# Patient Record
Sex: Male | Born: 1990 | Race: Black or African American | Hispanic: No | Marital: Single | Smoking: Current some day smoker
Health system: Southern US, Community
[De-identification: ages and names within clinical notes are randomized; demographics above are authoritative.]

## PROBLEM LIST (undated history)

## (undated) DIAGNOSIS — J45909 Unspecified asthma, uncomplicated: Secondary | ICD-10-CM

## (undated) DIAGNOSIS — F419 Anxiety disorder, unspecified: Secondary | ICD-10-CM

## (undated) DIAGNOSIS — G43909 Migraine, unspecified, not intractable, without status migrainosus: Secondary | ICD-10-CM

## (undated) DIAGNOSIS — S069X9A Unspecified intracranial injury with loss of consciousness of unspecified duration, initial encounter: Secondary | ICD-10-CM

---
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
## 2014-05-09 ENCOUNTER — Other Ambulatory Visit: Payer: Self-pay

## 2014-05-17 ENCOUNTER — Ambulatory Visit: Payer: Self-pay | Admitting: Family Medicine

## 2014-06-11 ENCOUNTER — Emergency Department (HOSPITAL_COMMUNITY)
Admission: EM | Admit: 2014-06-11 | Discharge: 2014-06-11 | Disposition: A | Discharge status: Home / Self Care | Payer: Self-pay | Attending: Emergency Medicine | Admitting: Emergency Medicine

## 2014-06-11 ENCOUNTER — Encounter (HOSPITAL_COMMUNITY): Payer: Self-pay

## 2014-06-11 DIAGNOSIS — Z8659 Personal history of other mental and behavioral disorders: Secondary | ICD-10-CM | POA: Insufficient documentation

## 2014-06-11 DIAGNOSIS — R630 Anorexia: Secondary | ICD-10-CM | POA: Insufficient documentation

## 2014-06-11 DIAGNOSIS — Z720 Tobacco use: Secondary | ICD-10-CM | POA: Insufficient documentation

## 2014-06-11 DIAGNOSIS — Z8679 Personal history of other diseases of the circulatory system: Secondary | ICD-10-CM | POA: Insufficient documentation

## 2014-06-11 DIAGNOSIS — R531 Weakness: Secondary | ICD-10-CM | POA: Insufficient documentation

## 2014-06-11 HISTORY — DX: Migraine, unspecified, not intractable, without status migrainosus: G43.909

## 2014-06-11 HISTORY — DX: Anxiety disorder, unspecified: F41.9

## 2014-06-11 LAB — I-STAT CHEM 8, ED
BUN: 12 mg/dL (ref 6–23)
CALCIUM ION: 1.17 mmol/L (ref 1.12–1.23)
Chloride: 104 mEq/L (ref 96–112)
Creatinine, Ser: 0.9 mg/dL (ref 0.50–1.35)
Glucose, Bld: 122 mg/dL — ABNORMAL HIGH (ref 70–99)
HCT: 49 % (ref 39.0–52.0)
Hemoglobin: 16.7 g/dL (ref 13.0–17.0)
Potassium: 3.6 mEq/L — ABNORMAL LOW (ref 3.7–5.3)
Sodium: 141 mEq/L (ref 137–147)
TCO2: 25 mmol/L (ref 0–100)

## 2014-06-11 LAB — CBG MONITORING, ED: GLUCOSE-CAPILLARY: 109 mg/dL — AB (ref 70–99)

## 2014-06-11 NOTE — ED Notes (Signed)
Bed: [id number] Expected date:  Expected time:  Means of arrival:  Comments: EMS 23 yo from shelter/syncopal episode

## 2014-06-11 NOTE — ED Notes (Signed)
[facility] from [facility] with 23 yo male with syncopal episode.  Per EMS, witnesses state he finished eating and stood up and sat back down and ? Passed out in the chair-per EMS, patient had fluttering eyes, states he told EMS he has been anxious and did not want to talk.

## 2014-06-11 NOTE — ED Notes (Signed)
Patient states he has been "feeling weak" for the past 2 days.  Patient states he has been under a lot of stress

## 2014-06-11 NOTE — ED Provider Notes (Signed)
CSN: [id number]     Arrival date & time 06/11/14  2051 History   First MD Initiated Contact with Patient 06/11/14 2055     Chief Complaint  Patient presents with  . Loss of Consciousness  . Anxiety      HPI Patient presents emergency department complaining of generalized weakness.  He is currently homeless and staying at a homeless shelter.  He reports decreased oral intake over the past 24 hours secondary to anorexia.  Denies nausea vomiting diarrhea.  No fevers or chills.  No chest pain shortness of breath.  No preceding palpitations.  He had an event where he reports that he "may have passed out".  At this time patient is without significant symptoms.   Past Medical History  Diagnosis Date  . Anxiety   . Migraines    History reviewed. No pertinent past surgical history. No family history on file. History  Substance Use Topics  . Smoking status: Current Some Day Smoker -- 5.00 packs/day    Types: Cigarettes  . Smokeless tobacco: Not on file  . Alcohol Use: Yes     Comment: occ beer    Review of Systems  All other systems reviewed and are negative.     Allergies  Review of patient's allergies indicates no known allergies.  Home Medications   Prior to Admission medications   Not on File   BP 137/70 mmHg  Pulse 70  Temp(Src) 98 F (36.7 C) (Oral)  Resp 20  Ht 5\' 9"  (1.753 m)  Wt 220 lb (99.791 kg)  BMI 32.47 kg/m2  SpO2 97% Physical Exam  Constitutional: He is oriented to person, place, and time. He appears well-developed and well-nourished.  HENT:  Head: Normocephalic and atraumatic.  Eyes: EOM are normal.  Neck: Normal range of motion.  Cardiovascular: Normal rate, regular rhythm, normal heart sounds and intact distal pulses.   Pulmonary/Chest: Effort normal and breath sounds normal. No respiratory distress.  Abdominal: Soft. He exhibits no distension. There is no tenderness.  Musculoskeletal: Normal range of motion.  Neurological: He is alert and  oriented to person, place, and time.  Skin: Skin is warm and dry.  Psychiatric: He has a normal mood and affect. Judgment normal.  Nursing note and vitals reviewed.   ED Course  Procedures (including critical care time) Labs Review Labs Reviewed  CBG MONITORING, ED - Abnormal; Notable for the following:    Glucose-Capillary 109 (*)    All other components within normal limits  I-STAT CHEM 8, ED - Abnormal; Notable for the following:    Potassium 3.6 (*)    Glucose, Bld 122 (*)    All other components within normal limits    Imaging Review No results found.   EKG Interpretation   Date/Time:  Monday June 11 2014 21:20:43 EST Ventricular Rate:  77 PR Interval:  197 QRS Duration: 89 QT Interval:  378 QTC Calculation: 428 R Axis:   46 Text Interpretation:  Sinus rhythm ST elevation suggests acute  pericarditis No significant change was found Confirmed by Cylis Ayars  MD,  Iolani Twilley ([id number]) on 06/11/2014 9:39:25 PM      MDM   Final diagnoses:  Weakness  Anorexia    Labs without significant abnormality.  Patient be discharged home in good condition this time.  EKG demonstrates normal sinus rhythm.  Early repolarization pattern in this young 23 year old male.  Medical screening examination completed.    Lyanne CoKevin M Kaylib Furness, MD 06/11/14 2141

## 2016-10-22 ENCOUNTER — Emergency Department (HOSPITAL_COMMUNITY)
Admission: EM | Admit: 2016-10-22 | Discharge: 2016-10-22 | Disposition: A | Discharge status: Home / Self Care | Payer: Self-pay | Attending: Emergency Medicine | Admitting: Emergency Medicine

## 2016-10-22 ENCOUNTER — Encounter (HOSPITAL_COMMUNITY): Payer: Self-pay

## 2016-10-22 ENCOUNTER — Emergency Department (HOSPITAL_COMMUNITY): Payer: Self-pay

## 2016-10-22 DIAGNOSIS — F17210 Nicotine dependence, cigarettes, uncomplicated: Secondary | ICD-10-CM | POA: Insufficient documentation

## 2016-10-22 DIAGNOSIS — K644 Residual hemorrhoidal skin tags: Secondary | ICD-10-CM | POA: Insufficient documentation

## 2016-10-22 HISTORY — DX: Unspecified intracranial injury with loss of consciousness of unspecified duration, initial encounter: S06.9X9A

## 2016-10-22 LAB — CBC WITH DIFFERENTIAL/PLATELET
Basophils Absolute: 0 10*3/uL (ref 0.0–0.1)
Basophils Relative: 0 %
EOS ABS: 0.1 10*3/uL (ref 0.0–0.7)
EOS PCT: 2 %
HCT: 45.6 % (ref 39.0–52.0)
Hemoglobin: 15.9 g/dL (ref 13.0–17.0)
Lymphocytes Relative: 35 %
Lymphs Abs: 1.5 10*3/uL (ref 0.7–4.0)
MCH: 31.3 pg (ref 26.0–34.0)
MCHC: 34.9 g/dL (ref 30.0–36.0)
MCV: 89.8 fL (ref 78.0–100.0)
MONOS PCT: 14 %
Monocytes Absolute: 0.6 10*3/uL (ref 0.1–1.0)
Neutro Abs: 2 10*3/uL (ref 1.7–7.7)
Neutrophils Relative %: 49 %
Platelets: 218 10*3/uL (ref 150–400)
RBC: 5.08 MIL/uL (ref 4.22–5.81)
RDW: 12.9 % (ref 11.5–15.5)
WBC: 4.2 10*3/uL (ref 4.0–10.5)

## 2016-10-22 LAB — BASIC METABOLIC PANEL
ANION GAP: 8 (ref 5–15)
BUN: 6 mg/dL (ref 6–20)
CHLORIDE: 103 mmol/L (ref 101–111)
CO2: 25 mmol/L (ref 22–32)
Calcium: 9.1 mg/dL (ref 8.9–10.3)
Creatinine, Ser: 0.94 mg/dL (ref 0.61–1.24)
GFR calc Af Amer: >60 mL/min (ref >60)
Glucose, Bld: 127 mg/dL — ABNORMAL HIGH (ref 65–99)
POTASSIUM: 3.7 mmol/L (ref 3.5–5.1)
SODIUM: 136 mmol/L (ref 135–145)

## 2016-10-22 LAB — I-STAT CG4 LACTIC ACID, ED: Lactic Acid, Venous: 2.54 mmol/L (ref 0.5–1.9)

## 2016-10-22 MED ORDER — LIDOCAINE-EPINEPHRINE (PF) 2 %-1:200000 IJ SOLN
INTRAMUSCULAR | Status: AC
  Administered 2016-10-22: 20 mL
  Filled 2016-10-22: qty 20

## 2016-10-22 MED ORDER — IOPAMIDOL (ISOVUE-300) INJECTION 61%
INTRAVENOUS | Status: AC
  Administered 2016-10-22: 100 mL
  Filled 2016-10-22: qty 100

## 2016-10-22 MED ORDER — SODIUM CHLORIDE 0.9 % IV BOLUS (SEPSIS)
1000.0000 mL | Freq: Once | INTRAVENOUS | Status: AC
  Administered 2016-10-22: 1000 mL via INTRAVENOUS

## 2016-10-22 MED ORDER — DOCUSATE SODIUM 100 MG PO CAPS: 100.0000 mg | ORAL_CAPSULE | Freq: Two times a day (BID) | ORAL | 0 refills | Status: DC

## 2016-10-22 MED ORDER — SODIUM CHLORIDE 0.9 % IV SOLN: Freq: Once | INTRAVENOUS | Status: DC

## 2016-10-22 MED ORDER — ONDANSETRON HCL 4 MG/2ML IJ SOLN
4.0000 mg | Freq: Once | INTRAMUSCULAR | Status: DC
  Filled 2016-10-22: qty 2

## 2016-10-22 MED ORDER — MORPHINE SULFATE (PF) 4 MG/ML IV SOLN
4.0000 mg | Freq: Once | INTRAVENOUS | Status: DC
  Filled 2016-10-22: qty 1

## 2016-10-22 NOTE — ED Notes (Signed)
Per PA patient needs to be moved into main ED.  Patient with Lactic of 2.54.  Dr. Fayrene Fearing made aware.

## 2016-10-22 NOTE — ED Notes (Signed)
Patient has requested a male provider.

## 2016-10-22 NOTE — Discharge Instructions (Signed)
Sit in warm bathtub 4 times daily for 30 minutes at a time. Take Tylenol or Advil for pain. Your blood pressure should be rechecked in a week. Today's was elevated at162/98 You can get your blood pressure rechecked at an urgent care center or call the numbers on this chart to get a primary care physician. Call [facility] to schedule appointment if you have continued or significant pain at your rectum after 3 or 4 days. Return if concern for any reason

## 2016-10-22 NOTE — ED Provider Notes (Signed)
MSE was initiated and I personally evaluated the patient and placed orders (if any) at  11:59 AM on October 22, 2016.  David Bailey is a 26 y.o. male who presents to the Emergency Department complaining of constant pain to the anus that began yesterday. He reports that he believes he cut his anus with his fingernails when wiping yesterday, and he noticed blood on the tissue. He reports swelling and yellow/bloody drainage from the area. Pt describes the pain as 10/10 constant aching non-radiating anal pain, is made worse with moving, and he has used epsom salts and numbing spray with mild, temporary relief.  Denies fever, chills, increased warmth, chest pain, SOB, abd pain, nausea, vomiting, diarrhea, constipation, melena, mucousy stooles, dysuria, hematuria, penile discharge, testicular pain/swelling, numbness, tingling weakness, rashes, or any other complaints at this time  On exam, there is a moderately sized perianal abscess at the anal verge, possibly extending into the rectum although exam limited due to pt not being on a stretcher and only being able to recline half way. Exquisitely tender, erythematous, and warm to the touch.   Given complaint and concern for perianal/perirectal abscess requiring possible surgical intervention, will get labs and CT scan, and move pt to the back.   The patient appears stable so that the remainder of the MSE may be completed by another provider.   [address], PA-C 10/22/16 1202    Lavera Guise, MD 10/22/16 2024

## 2016-10-22 NOTE — ED Provider Notes (Signed)
[facility]-EMERGENCY DEPT Provider Note   CSN: [id number] Arrival date & time: 10/22/16  1131     History   Chief Complaint Chief Complaint  Patient presents with  . Rectal Pain    HPI David Bailey is a 26 y.o. male.Complains perianal pain onset 2 days ago after he scratched his anus with a sharp fingernail. Has mild pain with bowel movements. Pain is worse with sitting on the area or with pressure improved not placing pressure on [facility] he denies abdominal pain denies change in appetite last bowel movement was today normal. No other associated symptoms no treatment prior to coming here  HPI  Past Medical History:  Diagnosis Date  . Anxiety   . Migraines   . TBI (traumatic brain injury) (HCC) 2011    There are no active problems to display for this patient.   History reviewed. No pertinent surgical history.     Home Medications    Prior to Admission medications   Not on File    Family History No family history on file.  Social History Social History  Substance Use Topics  . Smoking status: Current Some Day Smoker    Packs/day: 5.00    Types: Cigarettes  . Smokeless tobacco: Never Used  . Alcohol use Yes     Comment: occ beer     Allergies   Patient has no known allergies.   Review of Systems Review of Systems  Constitutional: Negative.   HENT: Negative.   Respiratory: Negative.   Cardiovascular: Negative.   Gastrointestinal: Positive for rectal pain.       Perianal pain  Musculoskeletal: Negative.   Skin: Negative.   Neurological: Negative.   Psychiatric/Behavioral: Negative.   All other systems reviewed and are negative.    Physical Exam Updated Vital Signs BP (!) 156/81 (BP Location: Left Arm)   Pulse 66   Temp 98.2 F (36.8 C) (Oral)   Resp 20   Ht  (1.753 m)   Wt 215 lb (97.5 kg)   SpO2 97%   BMI 31.75 kg/m   Physical Exam  Constitutional: He appears well-developed and well-nourished.  HENT:  Head: Normocephalic and  atraumatic.  Eyes: Conjunctivae are normal. Pupils are equal, round, and reactive to light.  Neck: Neck supple. No tracheal deviation present. No thyromegaly present.  Cardiovascular: Normal rate and regular rhythm.   No murmur heard. Pulmonary/Chest: Effort normal and breath sounds normal.  Abdominal: Soft. Bowel sounds are normal. He exhibits no distension. There is no tenderness.  Genitourinary: Penis normal.  Genitourinary Comments: Thrombosed hemorrhoid at 3:00  Musculoskeletal: Normal range of motion. He exhibits no edema or tenderness.  Neurological: He is alert. Coordination normal.  Skin: Skin is warm and dry. No rash noted.  Psychiatric: He has a normal mood and affect.  Nursing note and vitals reviewed.    ED Treatments / Results  Labs (all labs ordered are listed, but only abnormal results are displayed) Labs Reviewed  BASIC METABOLIC PANEL - Abnormal; Notable for the following:       Result Value   Glucose, Bld 127 (*)    All other components within normal limits  I-STAT CG4 LACTIC ACID, ED - Abnormal; Notable for the following:    Lactic Acid, Venous 2.54 (*)    All other components within normal limits  CBC WITH DIFFERENTIAL/PLATELET    EKG  EKG Interpretation None       Radiology Ct Abdomen Pelvis W Contrast  Result [id number] CLINICAL DATA:  26 year old male with perianal pain and possible infection. Initial encounter. EXAM: CT ABDOMEN AND PELVIS WITH CONTRAST TECHNIQUE: Multidetector CT imaging of the abdomen and pelvis was performed using the standard protocol following bolus administration of intravenous contrast. CONTRAST:  ISOVUE-300 IOPAMIDOL (ISOVUE-300) INJECTION 61% COMPARISON:  None. FINDINGS: Lower chest: Mild lung base atelectasis but otherwise negative. No pericardial or pleural effusion. Hepatobiliary: Liver and gallbladder are within normal limits. Pancreas: Negative. Spleen: Negative. Adrenals/Urinary Tract: Normal adrenal glands.  Normal bilateral renal enhancement. No hydronephrosis or perinephric stranding. No hydroureter. Diminutive urinary bladder which probably explains the appearance of mild wall thickening (series 2, image 75). No perivesical stranding. Stomach/Bowel: Normal appearance of the anal verge and perineum (series 2, image 92). No perineal or perianal inflammation is identified. The rectum and pelvic floor also appear within normal limits. Negative sigmoid colon aside from retained stool. Negative descending colon. Negative transverse colon. Negative right colon and appendix. Negative terminal ileum. No dilated small bowel. Negative stomach and duodenum. No abdominal free fluid. Vascular/Lymphatic: Major arterial structures in the abdomen and pelvis appear normal. Portal venous system is patent. No lymphadenopathy. Bilateral inguinal lymph nodes and iliac change nodes are within normal limits. Reproductive: Negative. Other: No pelvic free fluid. Musculoskeletal: Slight thoracic scoliosis. No acute osseous abnormality identified. IMPRESSION: 1. No perianal or perineal inflammation or abnormality identified. 2. Negative CT Abdomen and Pelvis. Electronically Signed   By: Odessa Fleming M.D.   On: 10/22/2016 14:26    Procedures Procedures (including critical care time)  Medications Ordered in ED Medications  0.9 %  sodium chloride infusion (not administered)  ondansetron (ZOFRAN) injection 4 mg (not administered)  morphine 4 MG/ML injection 4 mg (not administered)  sodium chloride 0.9 % bolus 1,000 mL (1,000 mLs Intravenous New Bag/Given 10/22/16 1312)  iopamidol (ISOVUE-300) 61 % injection (100 mLs  Contrast Given 10/22/16 1400)   Results for orders placed or performed during the hospital encounter of 10/22/16  CBC with Differential  Result Value Ref Range   WBC 4.2 4.0 - 10.5 K/uL   RBC 5.08 4.22 - 5.81 MIL/uL   Hemoglobin 15.9 13.0 - 17.0 g/dL   HCT 40.9 81.1 - 91.4 %   MCV 89.8 78.0 - 100.0 fL   MCH 31.3 26.0 -  34.0 pg   MCHC 34.9 30.0 - 36.0 g/dL   RDW 78.2 95.6 - 21.3 %   Platelets 218 150 - 400 K/uL   Neutrophils Relative % 49 %   Neutro Abs 2.0 1.7 - 7.7 K/uL   Lymphocytes Relative 35 %   Lymphs Abs 1.5 0.7 - 4.0 K/uL   Monocytes Relative 14 %   Monocytes Absolute 0.6 0.1 - 1.0 K/uL   Eosinophils Relative 2 %   Eosinophils Absolute 0.1 0.0 - 0.7 K/uL   Basophils Relative 0 %   Basophils Absolute 0.0 0.0 - 0.1 K/uL  Basic metabolic panel  Result Value Ref Range   Sodium 136 135 - 145 mmol/L   Potassium 3.7 3.5 - 5.1 mmol/L   Chloride 103 101 - 111 mmol/L   CO2 25 22 - 32 mmol/L   Glucose, Bld 127 (H) 65 - 99 mg/dL   BUN 6 6 - 20 mg/dL   Creatinine, Ser 0.86 0.61 - 1.24 mg/dL   Calcium 9.1 8.9 - 57.8 mg/dL   GFR calc non Af Amer >60 >60 mL/min   GFR calc Af Amer >60 >60 mL/min   Anion gap 8 5 - 15  I-Stat CG4 Lactic Acid,  ED  Result Value Ref Range   Lactic Acid, Venous 2.54 (HH) 0.5 - 1.9 mmol/L   Comment NOTIFIED PHYSICIAN    Ct Abdomen Pelvis W Contrast  Result Date: 10/22/2016 CLINICAL DATA:  26 year old male with perianal pain and possible infection. Initial encounter. EXAM: CT ABDOMEN AND PELVIS WITH CONTRAST TECHNIQUE: Multidetector CT imaging of the abdomen and pelvis was performed using the standard protocol following bolus administration of intravenous contrast. CONTRAST:  ISOVUE-300 IOPAMIDOL (ISOVUE-300) INJECTION 61% COMPARISON:  None. FINDINGS: Lower chest: Mild lung base atelectasis but otherwise negative. No pericardial or pleural effusion. Hepatobiliary: Liver and gallbladder are within normal limits. Pancreas: Negative. Spleen: Negative. Adrenals/Urinary Tract: Normal adrenal glands. Normal bilateral renal enhancement. No hydronephrosis or perinephric stranding. No hydroureter. Diminutive urinary bladder which probably explains the appearance of mild wall thickening (series 2, image 75). No perivesical stranding. Stomach/Bowel: Normal appearance of the anal verge  and perineum (series 2, image 92). No perineal or perianal inflammation is identified. The rectum and pelvic floor also appear within normal limits. Negative sigmoid colon aside from retained stool. Negative descending colon. Negative transverse colon. Negative right colon and appendix. Negative terminal ileum. No dilated small bowel. Negative stomach and duodenum. No abdominal free fluid. Vascular/Lymphatic: Major arterial structures in the abdomen and pelvis appear normal. Portal venous system is patent. No lymphadenopathy. Bilateral inguinal lymph nodes and iliac change nodes are within normal limits. Reproductive: Negative. Other: No pelvic free fluid. Musculoskeletal: Slight thoracic scoliosis. No acute osseous abnormality identified. IMPRESSION: 1. No perianal or perineal inflammation or abnormality identified. 2. Negative CT Abdomen and Pelvis. Electronically Signed   By: Odessa Fleming M.D.   On: 10/22/2016 14:26    Initial Impression / Assessment and Plan / ED Course  I have reviewed the triage vital signs and the nursing notes.  Pertinent labs & imaging results that were available during my care of the patient were reviewed by me and considered in my medical decision making (see chart for details).   procedure note. Timeout performed thrombosed hemorrhoid incised and drained by me.  The area was numbed locally with 2% lidocaine with epinephrine. An elliptical incision was made tangentially. Berry-like clots were picked out with a forceps. Patient tolerated procedure well.estimalted blood loss 2-3 ml  Plan sitz baths. Prescription Colace. Tylenol or Advil for pain. Return as needed or referral to [facility] surgery. Blood pressure recheck one week  Final Clinical Impressions(s) / ED Diagnoses  Diagnosis thrombosed external hemorrhoid #2elevated blood pressure Final diagnoses:  None    New Prescriptions New Prescriptions   No medications on file     Doug Sou, MD 10/22/16  1457

## 2016-10-22 NOTE — ED Notes (Signed)
Patient transported to CT 

## 2016-10-22 NOTE — ED Triage Notes (Signed)
Patient states that has anal pain that began yesterday.  Pateint denies hemorrhoids but, states that he believes that he cut himself with his nails while wiping yesterday.  Since then he has been sore and tender and believes that the area is swollen.  Patient states that he took an epsom salt bath and sprayed a numbing spray on the area (unsure of the name of the spray).  The pain was relieved for a short time but, is now back and he is worried it could be infected.

## 2016-10-22 NOTE — ED Notes (Signed)
Bed: WA21 Expected date:  Expected time:  Means of arrival:  Comments: Hold for triage 6

## 2016-10-22 NOTE — ED Notes (Signed)
Bed: WTR6 Expected date:  Expected time:  Means of arrival:  Comments: 

## 2016-12-23 ENCOUNTER — Encounter (HOSPITAL_COMMUNITY): Payer: Self-pay | Admitting: Emergency Medicine

## 2016-12-23 ENCOUNTER — Emergency Department (HOSPITAL_COMMUNITY)
Admission: EM | Admit: 2016-12-23 | Discharge: 2016-12-23 | Disposition: A | Discharge status: Home / Self Care | Payer: Self-pay | Attending: Emergency Medicine | Admitting: Emergency Medicine

## 2016-12-23 DIAGNOSIS — E860 Dehydration: Secondary | ICD-10-CM | POA: Insufficient documentation

## 2016-12-23 DIAGNOSIS — T675XXA Heat exhaustion, unspecified, initial encounter: Secondary | ICD-10-CM | POA: Insufficient documentation

## 2016-12-23 DIAGNOSIS — Y999 Unspecified external cause status: Secondary | ICD-10-CM | POA: Insufficient documentation

## 2016-12-23 DIAGNOSIS — F17210 Nicotine dependence, cigarettes, uncomplicated: Secondary | ICD-10-CM | POA: Insufficient documentation

## 2016-12-23 DIAGNOSIS — Y9301 Activity, walking, marching and hiking: Secondary | ICD-10-CM | POA: Insufficient documentation

## 2016-12-23 DIAGNOSIS — Y929 Unspecified place or not applicable: Secondary | ICD-10-CM | POA: Insufficient documentation

## 2016-12-23 LAB — CBC WITH DIFFERENTIAL/PLATELET
Basophils Absolute: 0 10*3/uL (ref 0.0–0.1)
Basophils Relative: 1 %
EOS ABS: 0.1 10*3/uL (ref 0.0–0.7)
Eosinophils Relative: 2 %
HCT: 44.5 % (ref 39.0–52.0)
HEMOGLOBIN: 15.3 g/dL (ref 13.0–17.0)
Lymphocytes Relative: 49 %
Lymphs Abs: 2.9 10*3/uL (ref 0.7–4.0)
MCH: 31.4 pg (ref 26.0–34.0)
MCHC: 34.4 g/dL (ref 30.0–36.0)
MCV: 91.4 fL (ref 78.0–100.0)
Monocytes Absolute: 0.5 10*3/uL (ref 0.1–1.0)
Monocytes Relative: 8 %
NEUTROS PCT: 40 %
Neutro Abs: 2.3 10*3/uL (ref 1.7–7.7)
Platelets: 231 10*3/uL (ref 150–400)
RBC: 4.87 MIL/uL (ref 4.22–5.81)
RDW: 12.3 % (ref 11.5–15.5)
WBC: 5.7 10*3/uL (ref 4.0–10.5)

## 2016-12-23 LAB — COMPREHENSIVE METABOLIC PANEL
ALK PHOS: 74 U/L (ref 38–126)
ALT: 20 U/L (ref 17–63)
AST: 26 U/L (ref 15–41)
Albumin: 4.5 g/dL (ref 3.5–5.0)
Anion gap: 8 (ref 5–15)
BUN: 7 mg/dL (ref 6–20)
CALCIUM: 9.3 mg/dL (ref 8.9–10.3)
CO2: 24 mmol/L (ref 22–32)
Chloride: 107 mmol/L (ref 101–111)
Creatinine, Ser: 0.94 mg/dL (ref 0.61–1.24)
GFR calc Af Amer: >60 mL/min (ref >60)
GFR calc non Af Amer: >60 mL/min (ref >60)
GLUCOSE: 119 mg/dL — AB (ref 65–99)
Potassium: 3.4 mmol/L — ABNORMAL LOW (ref 3.5–5.1)
SODIUM: 139 mmol/L (ref 135–145)
Total Bilirubin: 0.7 mg/dL (ref 0.3–1.2)
Total Protein: 7.7 g/dL (ref 6.5–8.1)

## 2016-12-23 LAB — URINALYSIS, ROUTINE W REFLEX MICROSCOPIC
BILIRUBIN URINE: NEGATIVE
Bacteria, UA: NONE SEEN
Glucose, UA: NEGATIVE mg/dL
HGB URINE DIPSTICK: NEGATIVE
KETONES UR: NEGATIVE mg/dL
Nitrite: NEGATIVE
PROTEIN: 30 mg/dL — AB
Specific Gravity, Urine: 1.031 — ABNORMAL HIGH (ref 1.005–1.030)
pH: 5 (ref 5.0–8.0)

## 2016-12-23 NOTE — Discharge Instructions (Signed)
Make sure to drink plenty of water.

## 2016-12-23 NOTE — ED Provider Notes (Signed)
[facility]-EMERGENCY DEPT Provider Note   CSN: [id number] Arrival date & time: 12/23/16  0054     History   Chief Complaint Chief Complaint  Patient presents with  . Fatigue    HPI Arnetha MassyLashaun Furnas is a 26 y.o. male.  HPI  This a 26 year old male with a history of traumatic brain injury who presents with generalized fatigue and leg pain. Patient reports that he was out walking all day yesterday "looking for a job." He is homeless and lives in a shelter. Reports decreased water intake. States that he feels very hydrated. He reports bilateral calf pain worse with standing. Reports some dizziness while being out in the heat. Denies chest pain, shortness of breath, abdominal pain.  Past Medical History:  Diagnosis Date  . Anxiety   . Migraines   . TBI (traumatic brain injury) (HCC) 2011    There are no active problems to display for this patient.   History reviewed. No pertinent surgical history.     Home Medications    Prior to Admission medications   Medication Sig Start Date End Date Taking? Authorizing Provider  docusate sodium (COLACE) 100 MG capsule Take 1 capsule (100 mg total) by mouth every 12 (twelve) hours. Patient not taking: Reported on 12/23/2016 10/22/16   Doug SouJacubowitz, Sam, MD    Family History No family history on file.  Social History Social History  Substance Use Topics  . Smoking status: Current Some Day Smoker    Packs/day: 5.00    Types: Cigarettes  . Smokeless tobacco: Never Used  . Alcohol use Yes     Comment: occ beer     Allergies   Patient has no known allergies.   Review of Systems Review of Systems  Constitutional: Positive for fatigue. Negative for fever.  Respiratory: Negative for shortness of breath.   Cardiovascular: Negative for chest pain and leg swelling.  Gastrointestinal: Negative for abdominal pain.  Musculoskeletal: Positive for myalgias.  All other systems reviewed and are negative.    Physical Exam Updated Vital  Signs BP 122/80   Pulse (!) 58   Temp 98.2 F (36.8 C)   Resp 16   SpO2 100%   Physical Exam  Constitutional: He is oriented to person, place, and time. He appears well-developed and well-nourished. No distress.  HENT:  Head: Normocephalic and atraumatic.  Mucous membranes dry  Cardiovascular: Normal rate, regular rhythm and normal heart sounds.   No murmur heard. Pulmonary/Chest: Effort normal and breath sounds normal. No respiratory distress. He has no wheezes.  Abdominal: Soft. Bowel sounds are normal. There is no tenderness. There is no rebound.  Musculoskeletal: He exhibits no edema.  No asymmetric swelling noted, no tenderness to the bilateral calves  Neurological: He is alert and oriented to person, place, and time.  Skin: Skin is warm and dry.  Psychiatric: He has a normal mood and affect.  Nursing note and vitals reviewed.    ED Treatments / Results  Labs (all labs ordered are listed, but only abnormal results are displayed) Labs Reviewed  COMPREHENSIVE METABOLIC PANEL - Abnormal; Notable for the following:       Result Value   Potassium 3.4 (*)    Glucose, Bld 119 (*)    All other components within normal limits  URINALYSIS, ROUTINE W REFLEX MICROSCOPIC - Abnormal; Notable for the following:    Specific Gravity, Urine 1.031 (*)    Protein, ur 30 (*)    Leukocytes, UA SMALL (*)    Squamous Epithelial /  LPF 0-5 (*)    All other components within normal limits  CBC WITH DIFFERENTIAL/PLATELET    EKG  EKG Interpretation None       Radiology No results found.  Procedures Procedures (including critical care time)  Medications Ordered in ED Medications - No data to display   Initial Impression / Assessment and Plan / ED Course  I have reviewed the triage vital signs and the nursing notes.  Pertinent labs & imaging results that were available during my care of the patient were reviewed by me and considered in my medical decision making (see chart for  details).     Patient presents for general fatigue, leg pain, concerns for dehydration. He reports that he was out walking most of the day yesterday looking for job. He is nontoxic. Vital signs reassuring. No significant signs of dehydration. Patient was able to orally hydrate. Lab work is largely reassuring. No ketones in the urine. Suspect heat exhaustion.  After history, exam, and medical workup I feel the patient has been appropriately medically screened and is safe for discharge home. Pertinent diagnoses were discussed with the patient. Patient was given return precautions.   Final Clinical Impressions(s) / ED Diagnoses   Final diagnoses:  Heat exhaustion, initial encounter  Dehydration    New Prescriptions Discharge Medication List as of 12/23/2016  5:45 AM       Wilkie Aye, Mayer Masker, MD 12/23/16 [phone]

## 2016-12-23 NOTE — ED Notes (Signed)
Patient given water to drink.  

## 2016-12-23 NOTE — ED Triage Notes (Signed)
Patient from the streets, has been walking in the heat for the last few days.  Minimal food and water intake for the last few days, having some pain in bilat legs.  Patient having some minimal weakness and dizziness.

## 2017-08-21 ENCOUNTER — Encounter (HOSPITAL_COMMUNITY): Payer: Self-pay | Admitting: Emergency Medicine

## 2017-08-21 ENCOUNTER — Other Ambulatory Visit: Payer: Self-pay

## 2017-08-21 DIAGNOSIS — F17210 Nicotine dependence, cigarettes, uncomplicated: Secondary | ICD-10-CM | POA: Insufficient documentation

## 2017-08-21 DIAGNOSIS — K649 Unspecified hemorrhoids: Secondary | ICD-10-CM | POA: Insufficient documentation

## 2017-08-21 NOTE — ED Triage Notes (Signed)
Pt transported from [facility], per EMS pt states 2 days ago while wiping with toilet tissue he may have scratched or cut himself, pt states bleeding has not stopped.  VSS, A & O

## 2017-08-22 ENCOUNTER — Emergency Department (HOSPITAL_COMMUNITY)
Admission: EM | Admit: 2017-08-22 | Discharge: 2017-08-22 | Disposition: A | Discharge status: Home / Self Care | Payer: Self-pay | Attending: Emergency Medicine | Admitting: Emergency Medicine

## 2017-08-22 ENCOUNTER — Other Ambulatory Visit: Payer: Self-pay

## 2017-08-22 DIAGNOSIS — K649 Unspecified hemorrhoids: Secondary | ICD-10-CM

## 2017-08-22 MED ORDER — HYDROCORTISONE 2.5 % RE CREA: TOPICAL_CREAM | RECTAL | 0 refills | Status: AC

## 2017-08-22 MED ORDER — HYDROCORTISONE ACETATE 25 MG RE SUPP
25.0000 mg | Freq: Once | RECTAL | Status: AC
  Administered 2017-08-22: 25 mg via RECTAL
  Filled 2017-08-22: qty 1

## 2017-08-22 NOTE — ED Provider Notes (Signed)
[facility] EMERGENCY DEPARTMENT Provider Note   CSN: [id number] Arrival date & time: 08/21/17  2241     History   Chief Complaint Chief Complaint  Patient presents with  . Rectal Bleeding    HPI David Bailey is a 27 y.o. male.  The history is provided by the patient and medical records.  Rectal Bleeding     27 year old male with history of anxiety, migraines, TBI, presenting to the ED with rectal bleeding.  Patient states of the past 3 days he was incarcerated and began having some rectal pain.  Does admit he has had difficulty passing stools lately and straining when doing so.  States he had a bowel movement in jail and noticed a lot of bright red blood.  He asked to see the nurse but they never came.  He was released yesterday and staying at a shelter when he noticed more blood and became concerned.  Thinks he may have a hemorrhoid which he has had in the past.  He is not currently on anticoagulation.  He denies any abdominal pain, nausea, vomiting, or diarrhea.  He has not had any medications for his symptoms.  Past Medical History:  Diagnosis Date  . Anxiety   . Migraines   . TBI (traumatic brain injury) (HCC) 2011    There are no active problems to display for this patient.   History reviewed. No pertinent surgical history.     Home Medications    Prior to Admission medications   Medication Sig Start Date End Date Taking? Authorizing Provider  docusate sodium (COLACE) 100 MG capsule Take 1 capsule (100 mg total) by mouth every 12 (twelve) hours. Patient not taking: Reported on 12/23/2016 10/22/16   Doug Sou, MD    Family History No family history on file.  Social History Social History   Tobacco Use  . Smoking status: Current Some Day Smoker    Packs/day: 5.00    Types: Cigarettes  . Smokeless tobacco: Never Used  Substance Use Topics  . Alcohol use: Yes    Comment: occ beer  . Drug use: Yes    Frequency: 4.0 times per week      Types: Marijuana     Allergies   Patient has no known allergies.   Review of Systems Review of Systems  Gastrointestinal: Positive for hematochezia and rectal pain.  All other systems reviewed and are negative.    Physical Exam Updated Vital Signs BP (!) 154/78 (BP Location: Right Arm)   Pulse 60   Temp 98 F (36.7 C) (Oral)   Resp 17   Ht 5\' 9"  (1.753 m)   Wt 90.7 kg (200 lb)   SpO2 100%   BMI 29.53 kg/m   Physical Exam  Constitutional: He is oriented to person, place, and time. He appears well-developed and well-nourished.  HENT:  Head: Normocephalic and atraumatic.  Mouth/Throat: Oropharynx is clear and moist.  Eyes: Conjunctivae and EOM are normal. Pupils are equal, round, and reactive to light.  Neck: Normal range of motion.  Cardiovascular: Normal rate, regular rhythm and normal heart sounds.  Pulmonary/Chest: Effort normal and breath sounds normal. No stridor. No respiratory distress.  Abdominal: Soft. Bowel sounds are normal. There is no tenderness. There is no rebound.  Genitourinary:  Genitourinary Comments: Moderately sized non-thrombosed, non-bleeding external hemorrhoid at the 3 o'clock position; otherwise rectum normal in appearance  Musculoskeletal: Normal range of motion.  Neurological: He is alert and oriented to person, place, and time.  Skin: Skin is warm and dry.  Psychiatric: He has a normal mood and affect.  Nursing note and vitals reviewed.    ED Treatments / Results  Labs (all labs ordered are listed, but only abnormal results are displayed) Labs Reviewed - No data to display  EKG  EKG Interpretation None       Radiology No results found.  Procedures Procedures (including critical care time)  Medications Ordered in ED Medications  hydrocortisone (ANUSOL-HC) suppository 25 mg (25 mg Rectal Given 08/22/17 0542)     Initial Impression / Assessment and Plan / ED Course  I have reviewed the triage vital signs and the  nursing notes.  Pertinent labs & imaging results that were available during my care of the patient were reviewed by me and considered in my medical decision making (see chart for details).  27 y.o. M here with rectal pain/bleeding.  Has been straining to have bowel movements.  On exam, has moderately sized non-thrombosed, non-bleeding external hemorrhoid.  Abdominal exam benign.  Suspect this may be from straining to have BM.  Will start on anusol.  Discussed supportive care with sitz  Bath, stool softeners, etc.  Does not currently have PCP-- follow up with wellness clinic.  Discussed plan with patient, he acknowledged understanding and agreed with plan of care.  Return precautions given for new or worsening symptoms.  Final Clinical Impressions(s) / ED Diagnoses   Final diagnoses:  Hemorrhoids, unspecified hemorrhoid type    ED Discharge Orders        Ordered    hydrocortisone (ANUSOL-HC) 2.5 % rectal cream     08/22/17 0600       Garlon HatchetSanders, Ritha Sampedro M, PA-C 08/22/17 0609    Ward, Layla MawKristen N, DO 08/22/17 [phone]

## 2017-08-22 NOTE — Discharge Instructions (Signed)
Take the prescribed medication as directed.  Can also do home sitz bath.  Use stool softeners to help ease bowel movements. Follow-up with your primary care doctor.  If you do not have one, you can see wellness clinic. Return to the ED for new or worsening symptoms.

## 2017-10-04 ENCOUNTER — Encounter (HOSPITAL_COMMUNITY): Payer: Self-pay | Admitting: Nurse Practitioner

## 2017-10-04 ENCOUNTER — Emergency Department (HOSPITAL_COMMUNITY)
Admission: EM | Admit: 2017-10-04 | Discharge: 2017-10-04 | Disposition: A | Discharge status: Home / Self Care | Payer: Self-pay | Attending: Emergency Medicine | Admitting: Emergency Medicine

## 2017-10-04 DIAGNOSIS — Z87820 Personal history of traumatic brain injury: Secondary | ICD-10-CM | POA: Insufficient documentation

## 2017-10-04 DIAGNOSIS — R109 Unspecified abdominal pain: Secondary | ICD-10-CM | POA: Insufficient documentation

## 2017-10-04 DIAGNOSIS — R51 Headache: Secondary | ICD-10-CM | POA: Insufficient documentation

## 2017-10-04 DIAGNOSIS — Z5900 Homelessness unspecified: Secondary | ICD-10-CM

## 2017-10-04 DIAGNOSIS — F1290 Cannabis use, unspecified, uncomplicated: Secondary | ICD-10-CM | POA: Insufficient documentation

## 2017-10-04 DIAGNOSIS — R531 Weakness: Secondary | ICD-10-CM | POA: Insufficient documentation

## 2017-10-04 DIAGNOSIS — F17210 Nicotine dependence, cigarettes, uncomplicated: Secondary | ICD-10-CM | POA: Insufficient documentation

## 2017-10-04 DIAGNOSIS — F419 Anxiety disorder, unspecified: Secondary | ICD-10-CM | POA: Insufficient documentation

## 2017-10-04 DIAGNOSIS — R079 Chest pain, unspecified: Secondary | ICD-10-CM | POA: Insufficient documentation

## 2017-10-04 DIAGNOSIS — Z79899 Other long term (current) drug therapy: Secondary | ICD-10-CM | POA: Insufficient documentation

## 2017-10-04 LAB — COMPREHENSIVE METABOLIC PANEL
ALT: 14 U/L — ABNORMAL LOW (ref 17–63)
ANION GAP: 9 (ref 5–15)
AST: 15 U/L (ref 15–41)
Albumin: 4.2 g/dL (ref 3.5–5.0)
Alkaline Phosphatase: 65 U/L (ref 38–126)
BILIRUBIN TOTAL: 0.8 mg/dL (ref 0.3–1.2)
BUN: 10 mg/dL (ref 6–20)
CO2: 25 mmol/L (ref 22–32)
Calcium: 9.1 mg/dL (ref 8.9–10.3)
Chloride: 102 mmol/L (ref 101–111)
Creatinine, Ser: 0.75 mg/dL (ref 0.61–1.24)
Glucose, Bld: 94 mg/dL (ref 65–99)
Potassium: 3.9 mmol/L (ref 3.5–5.1)
Sodium: 136 mmol/L (ref 135–145)
TOTAL PROTEIN: 7.6 g/dL (ref 6.5–8.1)

## 2017-10-04 LAB — CBC WITH DIFFERENTIAL/PLATELET
Basophils Absolute: 0 10*3/uL (ref 0.0–0.1)
Basophils Relative: 1 %
EOS PCT: 3 %
Eosinophils Absolute: 0.1 10*3/uL (ref 0.0–0.7)
HCT: 47.1 % (ref 39.0–52.0)
Hemoglobin: 15.8 g/dL (ref 13.0–17.0)
LYMPHS ABS: 1.9 10*3/uL (ref 0.7–4.0)
LYMPHS PCT: 53 %
MCH: 31.6 pg (ref 26.0–34.0)
MCHC: 33.5 g/dL (ref 30.0–36.0)
MCV: 94.2 fL (ref 78.0–100.0)
MONO ABS: 0.3 10*3/uL (ref 0.1–1.0)
MONOS PCT: 10 %
Neutro Abs: 1.2 10*3/uL — ABNORMAL LOW (ref 1.7–7.7)
Neutrophils Relative %: 33 %
PLATELETS: 236 10*3/uL (ref 150–400)
RBC: 5 MIL/uL (ref 4.22–5.81)
RDW: 12.8 % (ref 11.5–15.5)
WBC: 3.6 10*3/uL — ABNORMAL LOW (ref 4.0–10.5)

## 2017-10-04 LAB — RAPID URINE DRUG SCREEN, HOSP PERFORMED
AMPHETAMINES: NOT DETECTED
BENZODIAZEPINES: NOT DETECTED
Barbiturates: NOT DETECTED
Cocaine: NOT DETECTED
OPIATES: NOT DETECTED
TETRAHYDROCANNABINOL: POSITIVE — AB

## 2017-10-04 LAB — CK: Total CK: 220 U/L (ref 49–397)

## 2017-10-04 LAB — ETHANOL

## 2017-10-04 NOTE — BH Assessment (Signed)
[facility] Assessment Progress Note   Case was staffed with Arville CareParks FNP who recommended patient be discharged and follow up with OP resources that will be provided on discharge.

## 2017-10-04 NOTE — ED Notes (Signed)
Pt called to be placed in a room. Pt was snoring loudly on a chair in the lobby. Even with a sternal rub, the pt stayed asleep.

## 2017-10-04 NOTE — Discharge Instructions (Addendum)
For your mental health needs, you are advised to follow up with [facility].  New and returning patients are seen at their walk-in clinic.  Walk-in hours are Monday - Friday from 8:00 am - 3:00 pm.  Walk-in patients are seen on a first come, first served basis.  Try to arrive as early as possible for he best chance of being seen the same day: ° °     [facility] °     [address] °     [place], [place] [address] °     [phone] ° °For your shelter needs, contact the following service providers: ° °     [facility] (operated by [facility]) °     [address] °     [place], [place] [address] °     [phone] ° °     [facility] °     [address] °     [place], [place] [address] °     [phone] ° °For day shelter and other supportive services for the homeless, contact the [facility] ([facility]): ° °     [facility] °     [address] °     [place], [place] [address] °     [phone] ° °For transitional housing, contact one of the following agencies.  They provide longer term housing than a shelter, but there is an application process: ° °     [facility] °     [facility] °     [address] °     [place], [place] [address] °     [phone] °

## 2017-10-04 NOTE — ED Notes (Signed)
UNSUCCESSFUL LAB COLLECTION 

## 2017-10-04 NOTE — ED Triage Notes (Signed)
Pt is c/o pain all over, not having a place to stay, the shelters declining his request to stay at their lobby, pain all over, hopelessness and HA. States he was walking downtown and felt that he could not take it anymore. He denies being suicidal or homicidal, endorses "some drug use."

## 2017-10-04 NOTE — BH Assessment (Signed)
[facility]  Per Laveda AbbeLaurie Britton Parks, FNP, this pt does not require psychiatric hospitalization at this time.  [facility]  Pt would also benefit from information regarding supportive services for the homeless.  These have been included in pt's discharge instructions.  Pt's nurse, Susy FrizzleMatt, has been notified.  Doylene Canninghomas Mattie Novosel, MA Triage Specialist [phone]

## 2017-10-04 NOTE — ED Provider Notes (Signed)
Pt received at sign out with TTS pending. TTS has evaluated pt: no criteria for admission at this time, pt can be d/c with outpt f/u. Will d/c stable.   Results for orders placed or performed during the hospital encounter of 10/04/17  Comprehensive metabolic panel  Result Value Ref Range   Sodium 136 135 - 145 mmol/L   Potassium 3.9 3.5 - 5.1 mmol/L   Chloride 102 101 - 111 mmol/L   CO2 25 22 - 32 mmol/L   Glucose, Bld 94 65 - 99 mg/dL   BUN 10 6 - 20 mg/dL   Creatinine, Ser 4.090.75 0.61 - 1.24 mg/dL   Calcium 9.1 8.9 - 81.110.3 mg/dL   Total Protein 7.6 6.5 - 8.1 g/dL   Albumin 4.2 3.5 - 5.0 g/dL   AST 15 15 - 41 U/L   ALT 14 (L) 17 - 63 U/L   Alkaline Phosphatase 65 38 - 126 U/L   Total Bilirubin 0.8 0.3 - 1.2 mg/dL   GFR calc non Af Amer >60 >60 mL/min   GFR calc Af Amer >60 >60 mL/min   Anion gap 9 5 - 15  Ethanol  Result Value Ref Range   Alcohol, Ethyl (B) <10 <10 mg/dL  Rapid urine drug screen (hospital performed)  Result Value Ref Range   Opiates NONE DETECTED NONE DETECTED   Cocaine NONE DETECTED NONE DETECTED   Benzodiazepines NONE DETECTED NONE DETECTED   Amphetamines NONE DETECTED NONE DETECTED   Tetrahydrocannabinol POSITIVE (A) NONE DETECTED   Barbiturates NONE DETECTED NONE DETECTED  CK  Result Value Ref Range   Total CK 220 49 - 397 U/L  CBC with Differential/Platelet  Result Value Ref Range   WBC 3.6 (L) 4.0 - 10.5 K/uL   RBC 5.00 4.22 - 5.81 MIL/uL   Hemoglobin 15.8 13.0 - 17.0 g/dL   HCT 91.447.1 78.239.0 - 95.652.0 %   MCV 94.2 78.0 - 100.0 fL   MCH 31.6 26.0 - 34.0 pg   MCHC 33.5 30.0 - 36.0 g/dL   RDW 21.312.8 08.611.5 - 57.815.5 %   Platelets 236 150 - 400 K/uL   Neutrophils Relative % 33 %   Neutro Abs 1.2 (L) 1.7 - 7.7 K/uL   Lymphocytes Relative 53 %   Lymphs Abs 1.9 0.7 - 4.0 K/uL   Monocytes Relative 10 %   Monocytes Absolute 0.3 0.1 - 1.0 K/uL   Eosinophils Relative 3 %   Eosinophils Absolute 0.1 0.0 - 0.7 K/uL   Basophils Relative 1 %   Basophils Absolute 0.0  0.0 - 0.1 K/uL        Samuel JesterMcManus, Harrison Paulson, DO 10/04/17 1450

## 2017-10-04 NOTE — ED Provider Notes (Signed)
[facility]-EMERGENCY DEPT Provider Note   CSN: [id number] Arrival date & time: 10/04/17  0142     History   Chief Complaint Chief Complaint  Patient presents with  . Multiple Complaints    HPI David Bailey is a 27 y.o. male.  The history is provided by the patient.  Weakness  This is a new problem. Episode onset: Unknown. There was no focality noted. There has been no fever. Associated symptoms include headaches.  History of anxiety presents for weakness.  He reports generalized weakness for several days.  He reports mild headache and abdominal pain.  Also mentions he had chest pain recently, but none at this time.  He reports feeling very tired and weak  Past Medical History:  Diagnosis Date  . Anxiety   . Migraines   . TBI (traumatic brain injury) (HCC) 2011    There are no active problems to display for this patient.   History reviewed. No pertinent surgical history.      Home Medications    Prior to Admission medications   Medication Sig Start Date End Date Taking? Authorizing Provider  hydrocortisone (ANUSOL-HC) 2.5 % rectal cream Apply rectally 2 times daily 08/22/17   Garlon HatchetSanders, Lisa M, PA-C    Family History No family history on file.  Social History Social History   Tobacco Use  . Smoking status: Current Some Day Smoker    Packs/day: 5.00    Types: Cigarettes  . Smokeless tobacco: Never Used  Substance Use Topics  . Alcohol use: Yes    Comment: occ beer  . Drug use: Yes    Frequency: 4.0 times per week    Types: Marijuana     Allergies   Patient has no known allergies.   Review of Systems Review of Systems  Constitutional: Positive for fatigue. Negative for fever.  Gastrointestinal: Positive for abdominal pain.  Neurological: Positive for weakness and headaches.  All other systems reviewed and are negative.    Physical Exam Updated Vital Signs BP 131/74 (BP Location: Left Arm)   Pulse (!) 50   Temp (!) 97.3  F (36.3 C) (Oral)   Resp 14   Ht 1.753 m (5\' 9" )   Wt 90.7 kg (200 lb)   SpO2 100%   BMI 29.53 kg/m   Physical Exam CONSTITUTIONAL: Disheveled, sleeping when into the room HEAD: Normocephalic/atraumatic EYES: EOMI/PERRL, no nystagmus, pupils not pinpoint ENMT: Mucous membranes moist NECK: supple no meningeal signs SPINE/BACK:entire spine nontender CV: S1/S2 noted, no murmurs/rubs/gallops noted LUNGS: Lungs are clear to auscultation bilaterally, no apparent distress ABDOMEN: soft, nontender GU:no cva tenderness NEURO: Somnolent  But easily arousable  moves all extremitiesx4.  No facial droop.   EXTREMITIES: pulses normal/equal, full ROM SKIN: warm, color normal PSYCH: Unable to assess due to grogginess   ED Treatments / Results  Labs (all labs ordered are listed, but only abnormal results are displayed) Labs Reviewed  RAPID URINE DRUG SCREEN, HOSP PERFORMED - Abnormal; Notable for the following components:      Result Value   Tetrahydrocannabinol POSITIVE (*)    All other components within normal limits  COMPREHENSIVE METABOLIC PANEL  CBC WITH DIFFERENTIAL/PLATELET  ETHANOL  CK    EKG EKG Interpretation  Date/Time:  Monday October 04 2017 06:57:43 EDT Ventricular Rate:  47 PR Interval:    QRS Duration: 81 QT Interval:  424 QTC Calculation: 375 R Axis:   78 Text Interpretation:  Sinus or ectopic atrial bradycardia Confirmed by Zadie RhineWickline, Shine Scrogham ([id number])  on 10/04/2017 7:46:19 AM   Radiology No results found.  Procedures Procedures (including critical care time)  Medications Ordered in ED Medications - No data to display   Initial Impression / Assessment and Plan / ED Course  I have reviewed the triage vital signs and the nursing notes.  Pertinent labs results that were available during my care of the patient were reviewed by me and considered in my medical decision making (see chart for details).     6:50 AM Patient presents with multiple vague  complaints.  He reports pain all over, headache, abdominal pain.  He also reports fatigue and weakness.  When he initially arrived to the ER he told the nurse that he was declined at local homeless shelters.  He felt hopeless. He denies SI at this time.  He told nursing that he did drugs previously, but he denied this on my exam.  Screening labs and EKG have been ordered. 7:46 AM I signed out to Dr. Clarene Duke to follow-up on labs.  He did report that he was hopeless.  Will get TTS consult after labs. Final Clinical Impressions(s) / ED Diagnoses   Final diagnoses:  None    ED Discharge Orders    None       Zadie Rhine, MD 10/04/17 [phone]
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
## 2017-10-19 ENCOUNTER — Encounter: Payer: Self-pay | Admitting: Pediatric Intensive Care
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
## 2018-01-30 ENCOUNTER — Other Ambulatory Visit: Payer: Self-pay

## 2018-01-30 ENCOUNTER — Encounter (HOSPITAL_COMMUNITY): Payer: Self-pay | Admitting: Emergency Medicine

## 2018-01-30 ENCOUNTER — Emergency Department (HOSPITAL_COMMUNITY): Admission: EM | Admit: 2018-01-30 | Discharge: 2018-01-30 | Discharge status: Against Medical Advice | Payer: Self-pay

## 2018-01-30 ENCOUNTER — Emergency Department (HOSPITAL_COMMUNITY)
Admission: EM | Admit: 2018-01-30 | Discharge: 2018-01-30 | Disposition: A | Discharge status: Home / Self Care | Payer: Self-pay | Attending: Emergency Medicine | Admitting: Emergency Medicine

## 2018-01-30 DIAGNOSIS — R369 Urethral discharge, unspecified: Secondary | ICD-10-CM | POA: Insufficient documentation

## 2018-01-30 DIAGNOSIS — Z202 Contact with and (suspected) exposure to infections with a predominantly sexual mode of transmission: Secondary | ICD-10-CM | POA: Insufficient documentation

## 2018-01-30 DIAGNOSIS — Z711 Person with feared health complaint in whom no diagnosis is made: Secondary | ICD-10-CM

## 2018-01-30 DIAGNOSIS — F17210 Nicotine dependence, cigarettes, uncomplicated: Secondary | ICD-10-CM | POA: Insufficient documentation

## 2018-01-30 LAB — URINALYSIS, ROUTINE W REFLEX MICROSCOPIC
BACTERIA UA: NONE SEEN
Bilirubin Urine: NEGATIVE
Glucose, UA: NEGATIVE mg/dL
Hgb urine dipstick: NEGATIVE
Ketones, ur: NEGATIVE mg/dL
NITRITE: NEGATIVE
PROTEIN: NEGATIVE mg/dL
SPECIFIC GRAVITY, URINE: 1.021 (ref 1.005–1.030)
pH: 6 (ref 5.0–8.0)

## 2018-01-30 MED ORDER — CEFTRIAXONE SODIUM 250 MG IJ SOLR
250.0000 mg | Freq: Once | INTRAMUSCULAR | Status: AC
  Administered 2018-01-30: 250 mg via INTRAMUSCULAR
  Filled 2018-01-30: qty 250

## 2018-01-30 MED ORDER — AZITHROMYCIN 250 MG PO TABS
1000.0000 mg | ORAL_TABLET | Freq: Once | ORAL | Status: AC
  Administered 2018-01-30: 1000 mg via ORAL
  Filled 2018-01-30: qty 4

## 2018-01-30 MED ORDER — ONDANSETRON 4 MG PO TBDP
4.0000 mg | ORAL_TABLET | Freq: Once | ORAL | Status: AC
  Administered 2018-01-30: 4 mg via ORAL
  Filled 2018-01-30: qty 1

## 2018-01-30 NOTE — Discharge Instructions (Addendum)
Please refrain from any sexual activity for the next 2 weeks.  Your sexual partners need to be treated.  Develop fevers, consistent pain with bowel movements, or have additional concerns please seek additional medical care and evaluation.  Today you have been treated for gonorrhea and chlamydia.  The test to determine if you have these will take a few days. They will only call you if your tests come back positive, no news is good news. In the result that your tests are positive you have already been treated.

## 2018-01-30 NOTE — ED Notes (Signed)
Pt stable, ambulatory, states understanding of discharge instructions 

## 2018-01-30 NOTE — ED Notes (Signed)
Pt called X 2 for Triage. No answer.
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
## 2018-01-30 NOTE — ED Provider Notes (Signed)
[facility] EMERGENCY DEPARTMENT Provider Note   CSN: [id number] Arrival date & time: 01/30/18  1702     History   Chief Complaint Chief Complaint  Patient presents with  . Exposure to STD    HPI David Bailey is a 27 y.o. male with past medical history of TBI who presents today for evaluation of penile drainage.  He reports that on Thursday he had unprotected sex and since then he has been having green drainage from his penis.  He also reports that he is having generalized penile pain.  He denies any lesions on his penis.  No pain with bowel movements.  He is requesting STD testing today.  Denies abdominal pain, nausea, or vomiting.  HPI  Past Medical History:  Diagnosis Date  . Anxiety   . Migraines   . TBI (traumatic brain injury) (HCC) 2011    There are no active problems to display for this patient.   History reviewed. No pertinent surgical history.      Home Medications    Prior to Admission medications   Medication Sig Start Date End Date Taking? Authorizing Provider  hydrocortisone (ANUSOL-HC) 2.5 % rectal cream Apply rectally 2 times daily Patient not taking: Reported on 01/30/2018 08/22/17   Garlon Hatchet, PA-C    Family History No family history on file.  Social History Social History   Tobacco Use  . Smoking status: Current Some Day Smoker    Packs/day: 5.00    Types: Cigarettes  . Smokeless tobacco: Never Used  Substance Use Topics  . Alcohol use: Yes    Comment: occ beer  . Drug use: Yes    Frequency: 4.0 times per week    Types: Marijuana     Allergies   Patient has no known allergies.   Review of Systems Review of Systems  Constitutional: Negative for chills and fever.  Gastrointestinal: Negative for abdominal pain and blood in stool.  Genitourinary: Positive for discharge and penile pain. Negative for dysuria, flank pain, hematuria, scrotal swelling and testicular pain.  Skin: Negative for rash.    Allergic/Immunologic: Negative for immunocompromised state.  All other systems reviewed and are negative.    Physical Exam Updated Vital Signs BP 112/79 (BP Location: Right Arm)   Pulse 87   Temp 98.3 F (36.8 C) (Oral)   Resp 17   Ht 5\' 8"  (1.727 m)   Wt 90.7 kg (200 lb)   SpO2 97%   BMI 30.41 kg/m   Physical Exam  Constitutional: He appears well-developed and well-nourished. No distress.  HENT:  Head: Normocephalic and atraumatic.  Eyes: Conjunctivae are normal. Right eye exhibits no discharge. Left eye exhibits no discharge. No scleral icterus.  Neck: Normal range of motion.  Cardiovascular: Normal rate and regular rhythm.  Pulmonary/Chest: Effort normal. No stridor. No respiratory distress.  Abdominal: He exhibits no distension.  Genitourinary: Testes normal. Right testis shows no mass, no swelling and no tenderness. Left testis shows no mass, no swelling and no tenderness. Circumcised. No penile erythema. Discharge found.  Genitourinary Comments: Exam performed with chaperone in room.  Musculoskeletal: He exhibits no edema or deformity.  Neurological: He is alert. He exhibits normal muscle tone.  Skin: Skin is warm and dry. He is not diaphoretic.  Psychiatric: He has a normal mood and affect. His behavior is normal.  Nursing note and vitals reviewed.    ED Treatments / Results  Labs (all labs ordered are listed, but only abnormal results are displayed)  Labs Reviewed  URINALYSIS, ROUTINE W REFLEX MICROSCOPIC - Abnormal; Notable for the following components:      Result Value   APPearance HAZY (*)    Leukocytes, UA LARGE (*)    WBC, UA >50 (*)    All other components within normal limits  HIV ANTIBODY (ROUTINE TESTING)  RPR  GC/CHLAMYDIA PROBE AMP ([place]) NOT AT [facility]    EKG None  Radiology No results found.  Procedures Procedures (including critical care time)  Medications Ordered in ED Medications  cefTRIAXone (ROCEPHIN) injection 250 mg (250  mg Intramuscular Given 01/30/18 1836)  azithromycin (ZITHROMAX) tablet 1,000 mg (1,000 mg Oral Given 01/30/18 1835)  ondansetron (ZOFRAN-ODT) disintegrating tablet 4 mg (4 mg Oral Given 01/30/18 1836)     Initial Impression / Assessment and Plan / ED Course  I have reviewed the triage vital signs and the nursing notes.  Pertinent labs & imaging results that were available during my care of the patient were reviewed by me and considered in my medical decision making (see chart for details).    Patient is afebrile without abdominal tenderness, abdominal pain or painful bowel movements to indicate prostatitis.  No tenderness to palpation of the testes or epididymis to suggest orchitis or epididymitis.  STD cultures obtained including HIV, syphilis, gonorrhea and chlamydia. Patient to be discharged with instructions to follow up with PCP. Discussed importance of using protection when sexually active. Pt understands that they have GC/Chlamydia cultures pending and that they will need to inform all sexual partners if results return positive. Patient has been treated prophylactically with azithromycin and Rocephin given urine consistent with bacterial infection.     Final Clinical Impressions(s) / ED Diagnoses   Final diagnoses:  Penile discharge  Concern about STD in male without diagnosis    ED Discharge Orders    None       Norman ClayHammond, Elizabeth W, PA-C 01/30/18 2151    Rolan BuccoBelfi, Melanie, MD 01/30/18 2213

## 2018-01-30 NOTE — ED Triage Notes (Signed)
Called for triage X1 

## 2018-01-31 LAB — HIV ANTIBODY (ROUTINE TESTING W REFLEX): HIV Screen 4th Generation wRfx: NONREACTIVE

## 2018-01-31 LAB — GC/CHLAMYDIA PROBE AMP (~~LOC~~) NOT AT ARMC
Chlamydia: NEGATIVE
NEISSERIA GONORRHEA: POSITIVE — AB

## 2018-01-31 LAB — RPR: RPR Ser Ql: NONREACTIVE

## 2018-04-16 ENCOUNTER — Encounter (HOSPITAL_COMMUNITY): Payer: Self-pay | Admitting: Emergency Medicine

## 2018-04-16 ENCOUNTER — Other Ambulatory Visit: Payer: Self-pay

## 2018-04-16 ENCOUNTER — Emergency Department (HOSPITAL_COMMUNITY)
Admission: EM | Admit: 2018-04-16 | Discharge: 2018-04-16 | Disposition: A | Discharge status: Home / Self Care | Payer: Self-pay | Attending: Emergency Medicine | Admitting: Emergency Medicine

## 2018-04-16 DIAGNOSIS — Z202 Contact with and (suspected) exposure to infections with a predominantly sexual mode of transmission: Secondary | ICD-10-CM | POA: Insufficient documentation

## 2018-04-16 DIAGNOSIS — F17210 Nicotine dependence, cigarettes, uncomplicated: Secondary | ICD-10-CM | POA: Insufficient documentation

## 2018-04-16 DIAGNOSIS — J45909 Unspecified asthma, uncomplicated: Secondary | ICD-10-CM | POA: Insufficient documentation

## 2018-04-16 HISTORY — DX: Unspecified asthma, uncomplicated: J45.909

## 2018-04-16 LAB — URINALYSIS, ROUTINE W REFLEX MICROSCOPIC
BILIRUBIN URINE: NEGATIVE
GLUCOSE, UA: NEGATIVE mg/dL
Hgb urine dipstick: NEGATIVE
KETONES UR: NEGATIVE mg/dL
LEUKOCYTES UA: NEGATIVE
Nitrite: NEGATIVE
PROTEIN: NEGATIVE mg/dL
Specific Gravity, Urine: 1.026 (ref 1.005–1.030)
pH: 5 (ref 5.0–8.0)

## 2018-04-16 MED ORDER — AZITHROMYCIN 250 MG PO TABS
1000.0000 mg | ORAL_TABLET | Freq: Once | ORAL | Status: AC
  Administered 2018-04-16: 1000 mg via ORAL
  Filled 2018-04-16: qty 4

## 2018-04-16 MED ORDER — CEFTRIAXONE SODIUM 250 MG IJ SOLR
250.0000 mg | Freq: Once | INTRAMUSCULAR | Status: AC
  Administered 2018-04-16: 250 mg via INTRAMUSCULAR
  Filled 2018-04-16: qty 250

## 2018-04-16 NOTE — ED Triage Notes (Signed)
Pt. Stated, AI need to be checked for STD, my girlfriend tested positive for Chlamydia last week and told I need a check up.

## 2018-04-16 NOTE — ED Provider Notes (Signed)
[facility] EMERGENCY DEPARTMENT Provider Note   CSN: [id number] Arrival date & time: 04/16/18  4098     History   Chief Complaint Chief Complaint  Patient presents with  . SEXUALLY TRANSMITTED DISEASE    HPI David Bailey is a 27 y.o. male presenting for STD check.  Patient states his partner has tested positive for chlamydia and trichomonas, and he wants testing and treatment.  Patient states he is sexually active with one male partner.  He denies symptoms.  He denies fevers, chills, nausea, vomiting, abdominal pain, penile discharge, penile pain, or testicular pain or swelling.   Additional history obtained from chart review, he was recently seen in the ER where he tested positive for chlamydia.  Was treated at that time. Patient denies any medical problems, takes no medications daily.  HPI  Past Medical History:  Diagnosis Date  . Anxiety   . Asthma   . Migraines   . TBI (traumatic brain injury) (HCC) 2011    There are no active problems to display for this patient.   History reviewed. No pertinent surgical history.      Home Medications    Prior to Admission medications   Medication Sig Start Date End Date Taking? Authorizing Provider  hydrocortisone (ANUSOL-HC) 2.5 % rectal cream Apply rectally 2 times daily Patient not taking: Reported on 01/30/2018 08/22/17   Garlon Hatchet, PA-C    Family History No family history on file.  Social History Social History   Tobacco Use  . Smoking status: Current Some Day Smoker    Packs/day: 5.00    Types: Cigarettes  . Smokeless tobacco: Never Used  Substance Use Topics  . Alcohol use: Yes    Comment: occ beer  . Drug use: Yes    Frequency: 4.0 times per week    Types: Marijuana     Allergies   Patient has no known allergies.   Review of Systems Review of Systems  Genitourinary: Negative for discharge, dysuria, frequency, hematuria, penile pain and penile swelling.     Physical  Exam Updated Vital Signs BP 135/81 (BP Location: Right Arm)   Pulse 83   Temp 98.3 F (36.8 C) (Oral)   Resp 20   Ht 5\' 9"  (1.753 m)   Wt 83.5 kg   SpO2 97%   BMI 27.17 kg/m   Physical Exam  Constitutional: He is oriented to person, place, and time. He appears well-developed and well-nourished. No distress.  HENT:  Head: Normocephalic and atraumatic.  Eyes: EOM are normal.  Neck: Normal range of motion.  Cardiovascular: Normal rate, regular rhythm and intact distal pulses.  Pulmonary/Chest: Effort normal and breath sounds normal.  Abdominal: Soft. He exhibits no distension. There is no tenderness.  Genitourinary: Testes normal and penis normal. Circumcised.  Genitourinary Comments: Chaperone present.  No inguinal lymphadenopathy.  No tenderness of the penis or testicles.  No penile discharge.  Musculoskeletal: Normal range of motion.  Lymphadenopathy: No inguinal adenopathy noted on the right or left side.  Neurological: He is alert and oriented to person, place, and time.  Skin: Skin is warm. Capillary refill takes less than 2 seconds. No rash noted.  Psychiatric: He has a normal mood and affect.  Nursing note and vitals reviewed.    ED Treatments / Results  Labs (all labs ordered are listed, but only abnormal results are displayed) Labs Reviewed  URINALYSIS, ROUTINE W REFLEX MICROSCOPIC  GC/CHLAMYDIA PROBE AMP ([place]) NOT AT [facility]  EKG None  Radiology No results found.  Procedures Procedures (including critical care time)  Medications Ordered in ED Medications  azithromycin (ZITHROMAX) tablet 1,000 mg (1,000 mg Oral Given 04/16/18 1020)  cefTRIAXone (ROCEPHIN) injection 250 mg (250 mg Intramuscular Given 04/16/18 1026)     Initial Impression / Assessment and Plan / ED Course  I have reviewed the triage vital signs and the nursing notes.  Pertinent labs & imaging results that were available during my care of the patient were reviewed by me and  considered in my medical decision making (see chart for details).     Pt presenting for STD check.  Physical exam reassuring, patient is a symptomatically without penile pain or discharge.  Gonorrhea and chlamydia sent.  As partner has a positive test, offered treatment today versus waiting for results.  Patient elects to get treated today, will give azithromycin and Rocephin.  Urine sent to assess for trichomoniasis.  Urine negative for trichomoniasis.  Discussed with patient.  Patient elects to not get treatment for trichomonas today.  Discussed if he has any further symptoms or wants further testing, he needs to follow-up with the health department.  At this time, patient appears safe for discharge.  Return precautions given.  Patient states he understands agrees plan.   Final Clinical Impressions(s) / ED Diagnoses   Final diagnoses:  STD exposure    ED Discharge Orders    None       Alveria Apley, PA-C 04/16/18 1153    Mancel Bale, MD 04/16/18 1611

## 2018-04-16 NOTE — ED Notes (Signed)
Pt's visitor reports testing positive for trichomonas. EDP made aware.

## 2018-04-16 NOTE — Discharge Instructions (Addendum)
You were treated for gonorrhea and chlamydia today in the emergency room.  The results are pending.  If you get a phone call saying that you are positive, you have already been treated and do not need further treatment.  However, do not have sex for the next 2 weeks, as there is risk of reinfection. If you are having penile discharge or other signs of an STD, follow-up with the health department for further treating and testing. If you have concerns about STDs, you should follow-up with the health department.  The emergency room does not normally test when there are no symptoms. Follow-up with [facility] to establish primary care. Return to the emergency room with any new, worsening, or concerning symptoms.

## 2018-04-18 LAB — GC/CHLAMYDIA PROBE AMP (~~LOC~~) NOT AT ARMC
Chlamydia: NEGATIVE
NEISSERIA GONORRHEA: NEGATIVE

## 2018-07-05 IMAGING — CT CT ABD-PELV W/ CM
2 of 4 series · 16 of 46 positions shown, 18 images · IV contrast (ISOVUE)
Comparison: None.

CLINICAL DATA: 25-year-old male with perianal pain and possible
infection. Initial encounter.

EXAM:
CT ABDOMEN AND PELVIS WITH CONTRAST
TECHNIQUE: Multidetector CT imaging of the abdomen and pelvis was performed
using the standard protocol following bolus administration of
intravenous contrast.
CONTRAST:  100mL ZV9UDF-EYY IOPAMIDOL (ZV9UDF-EYY) INJECTION 61%

[Series 2: abd/pel with · axial · 0.71mm/px · z∈[-540,-106]mm · 13 of 99 slices shown, 15 images]
[im 6/99  soft-tissue]
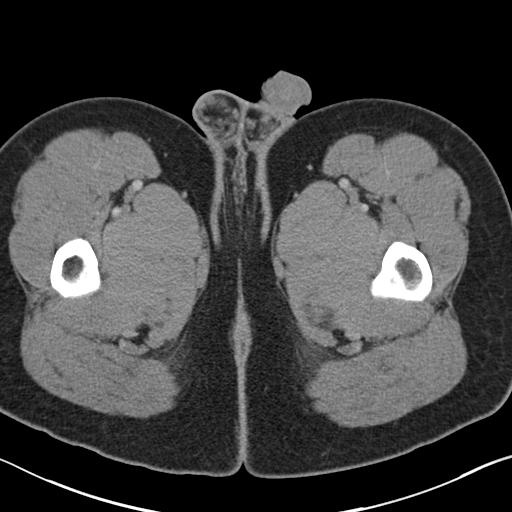
[im 6/99  bone]
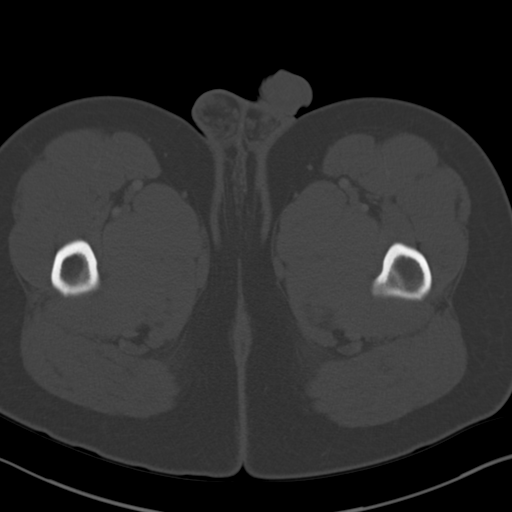
[im 11/99  soft-tissue]
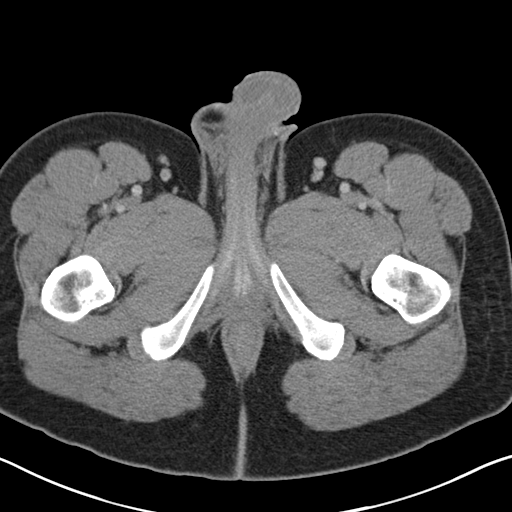
[im 22/99  soft-tissue]
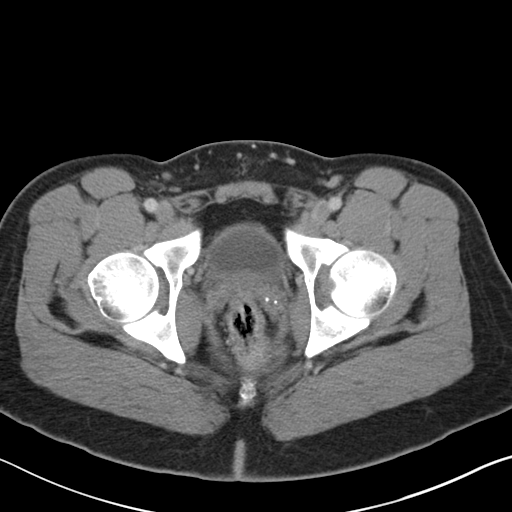
[im 28/99  soft-tissue]
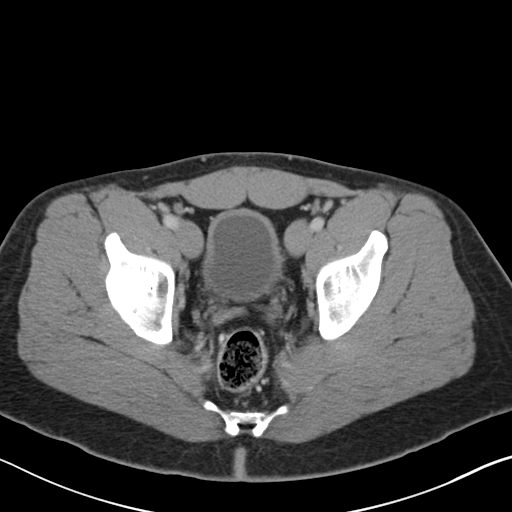
[im 33/99  soft-tissue]
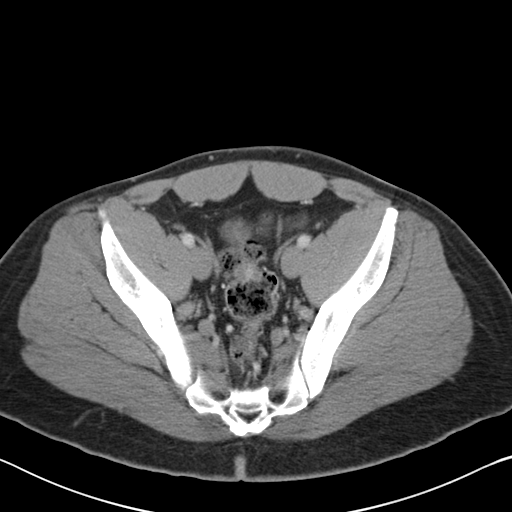
[im 44/99  soft-tissue]
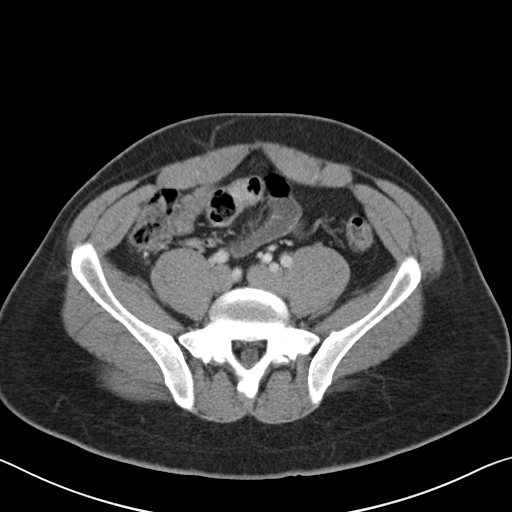
[im 50/99  soft-tissue]
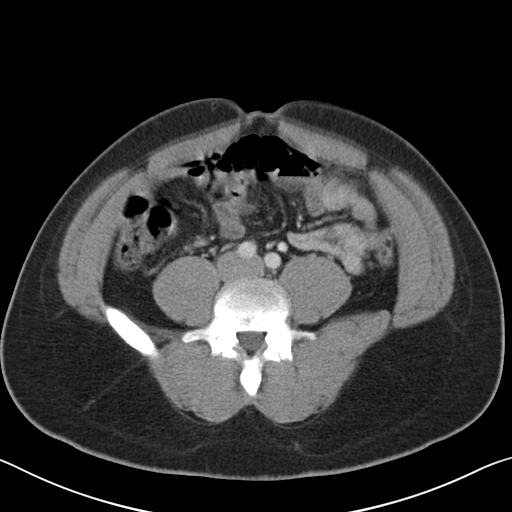
[im 55/99  soft-tissue]
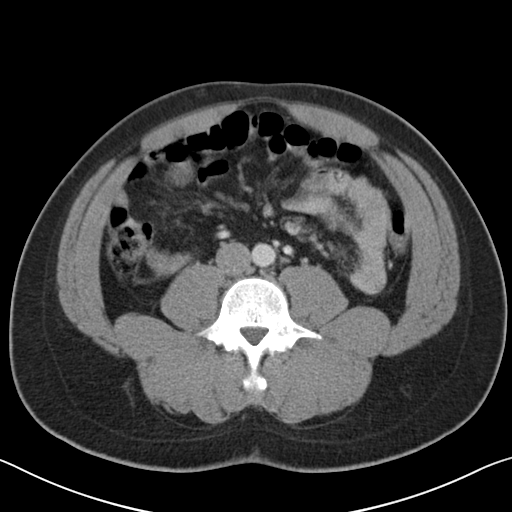
[im 66/99  soft-tissue]
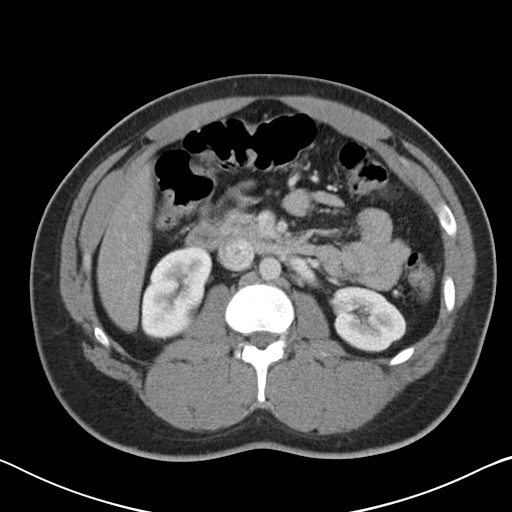
[im 66/99  bone]
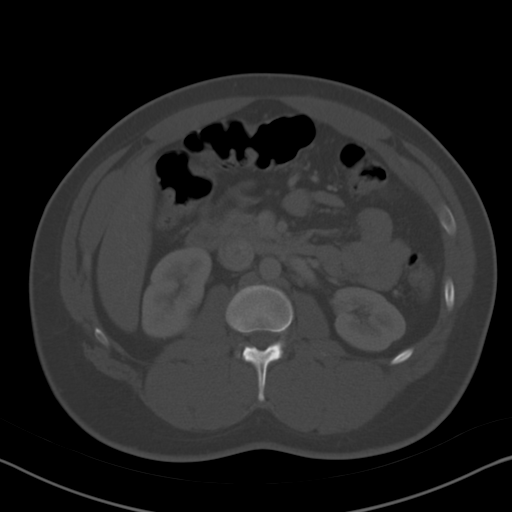
[im 71/99  soft-tissue]
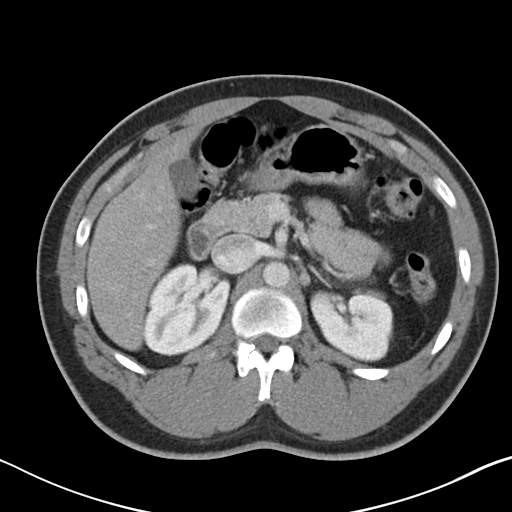
[im 77/99  soft-tissue]
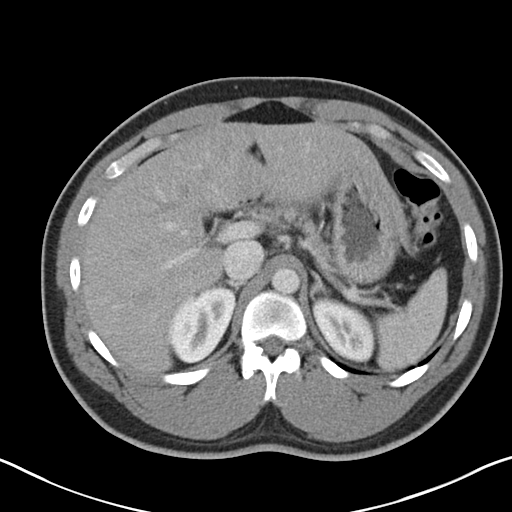
[im 88/99  soft-tissue]
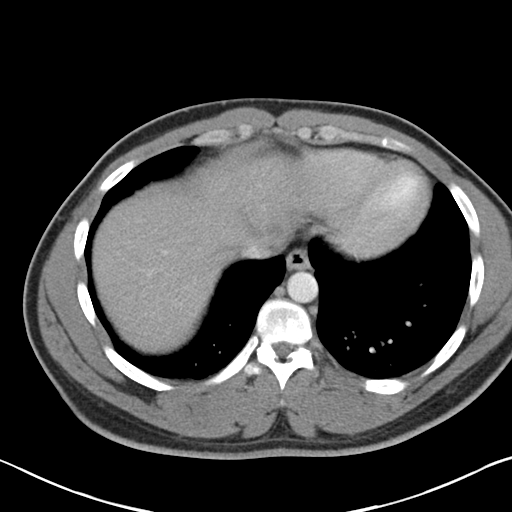
[im 93/99  soft-tissue]
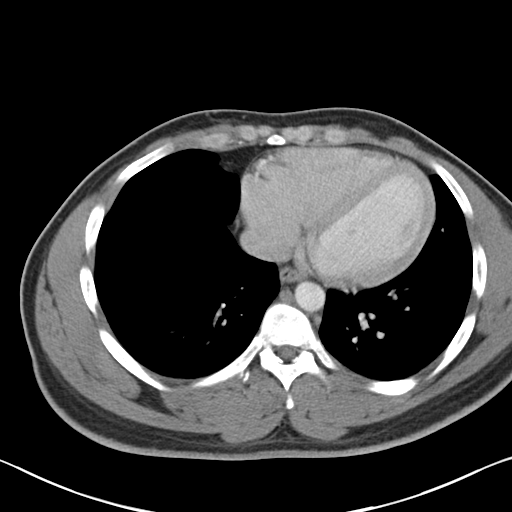

[Series 5: coronal a/|p · coronal · 0.83mm/px · 3 of 131 slices shown]
[im 44/131  soft-tissue]
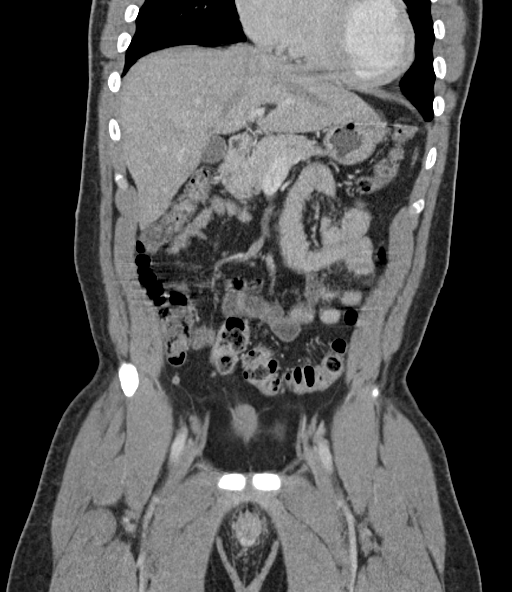
[im 58/131  soft-tissue]
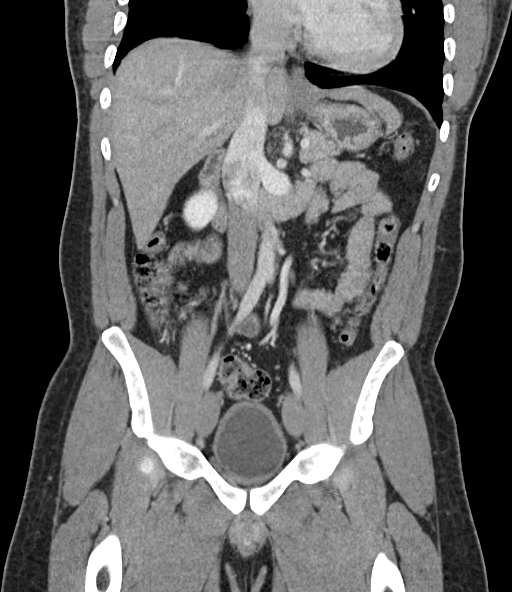
[im 73/131  soft-tissue]
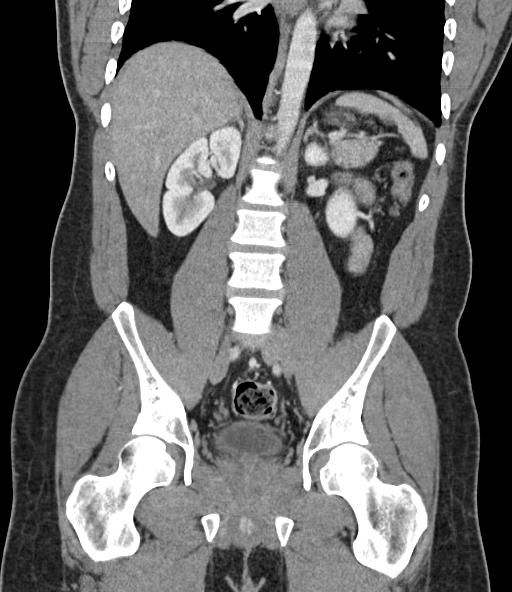

[16 of 46 positions shown; findings below may reference images not displayed]

FINDINGS: Lower chest: Mild lung base atelectasis but otherwise negative. No
pericardial or pleural effusion.

Hepatobiliary: Liver and gallbladder are within normal limits.

Pancreas: Negative.

Spleen: Negative.

Adrenals/Urinary Tract: Normal adrenal glands.

Normal bilateral renal enhancement. No hydronephrosis or perinephric
stranding. No hydroureter.

Diminutive urinary bladder which probably explains the appearance of
mild wall thickening (series 2, image 75). No perivesical stranding.

Stomach/Bowel: Normal appearance of the anal verge and perineum
(series 2, image 92). No perineal or perianal inflammation is
identified.

The rectum and pelvic floor also appear within normal limits.

Negative sigmoid colon aside from retained stool. Negative
descending colon. Negative transverse colon. Negative right colon
and appendix. Negative terminal ileum. No dilated small bowel.
Negative stomach and duodenum.

No abdominal free fluid.

Vascular/Lymphatic: Major arterial structures in the abdomen and
pelvis appear normal. Portal venous system is patent.

No lymphadenopathy. Bilateral inguinal lymph nodes and iliac change
nodes are within normal limits.

Reproductive: Negative.

Other: No pelvic free fluid.

Musculoskeletal: Slight thoracic scoliosis. No acute osseous
abnormality identified.
IMPRESSION: 1. No perianal or perineal inflammation or abnormality identified.
2. Negative CT Abdomen and Pelvis.

## 2020-01-02 ENCOUNTER — Emergency Department (HOSPITAL_COMMUNITY)
Admission: EM | Admit: 2020-01-02 | Discharge: 2020-01-02 | Payer: Self-pay | Attending: Emergency Medicine | Admitting: Emergency Medicine

## 2020-01-02 ENCOUNTER — Encounter (HOSPITAL_COMMUNITY): Payer: Self-pay

## 2020-01-02 DIAGNOSIS — Y929 Unspecified place or not applicable: Secondary | ICD-10-CM | POA: Insufficient documentation

## 2020-01-02 DIAGNOSIS — F17210 Nicotine dependence, cigarettes, uncomplicated: Secondary | ICD-10-CM | POA: Insufficient documentation

## 2020-01-02 DIAGNOSIS — Y999 Unspecified external cause status: Secondary | ICD-10-CM | POA: Insufficient documentation

## 2020-01-02 DIAGNOSIS — Y9389 Activity, other specified: Secondary | ICD-10-CM | POA: Insufficient documentation

## 2020-01-02 DIAGNOSIS — Z23 Encounter for immunization: Secondary | ICD-10-CM | POA: Insufficient documentation

## 2020-01-02 DIAGNOSIS — S0181XA Laceration without foreign body of other part of head, initial encounter: Secondary | ICD-10-CM | POA: Insufficient documentation

## 2020-01-02 DIAGNOSIS — J45909 Unspecified asthma, uncomplicated: Secondary | ICD-10-CM | POA: Insufficient documentation

## 2020-01-02 MED ORDER — TETANUS-DIPHTH-ACELL PERTUSSIS 5-2.5-18.5 LF-MCG/0.5 IM SUSP
0.5000 mL | Freq: Once | INTRAMUSCULAR | Status: AC
  Administered 2020-01-02: 01:00:00 0.5 mL via INTRAMUSCULAR
  Filled 2020-01-02: qty 0.5
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
